# Patient Record
Sex: Female | Born: 1970 | Race: White | Hispanic: No | Marital: Married
Health system: Midwestern US, Community
[De-identification: ages and names within clinical notes are randomized; demographics above are authoritative.]

## PROBLEM LIST (undated history)

## (undated) DIAGNOSIS — G43829 Menstrual migraine, not intractable, without status migrainosus: Secondary | ICD-10-CM

## (undated) DIAGNOSIS — R928 Other abnormal and inconclusive findings on diagnostic imaging of breast: Secondary | ICD-10-CM

## (undated) DIAGNOSIS — R922 Inconclusive mammogram: Secondary | ICD-10-CM

## (undated) DIAGNOSIS — E039 Hypothyroidism, unspecified: Secondary | ICD-10-CM

## (undated) HISTORY — PX: TONSILLECTOMY: SUR1361

---

## 2008-12-04 MED ORDER — DEXAMETHASONE 0.5 MG TAB
0.5 mg | ORAL_TABLET | ORAL | Status: DC
Start: 2008-12-04 — End: 2009-02-03

## 2008-12-04 MED ORDER — LEVOTHYROXINE 50 MCG TAB
50 mcg | ORAL_TABLET | Freq: Every day | ORAL | Status: DC
Start: 2008-12-04 — End: 2009-02-03

## 2008-12-04 NOTE — Patient Instructions (Signed)
1) Take Dexamethasone 0.5 mg 2 tabs at 11pm the night before you come for lab draw between 7:30-9am the next morning.  Come to the lab tomorrow at 8:15am.    2) Take thyroid supplement on an empty stomach (either first thing in the morning or at bedtime), 4 hours apart from any vitamin, iron, or calcium supplement.  If you miss a dose, you can take 2 tablets the next day to catch up.    3) Call [facility] at [phone] 1 week before next appointment to schedule lab draw 2-3 days before next appointment.

## 2008-12-04 NOTE — Progress Notes (Addendum)
Chief Complaint   Patient presents with   ??? Thyroid Problem       History of Present Illness: Cheryl Carter is a 38 y.o. female referred by Dr. Clair Gulling for evaluation of her thyroid.  Went to see Dr. Debara Pickett in February for evaluation of a few symptoms: 10 lb wt gain over 3 months, cold intolerance, fatigue.  Skin is always dry in the winter.  Is currently living in a house with well water so has noticed some change in her hair and nails and doesn't know if these are related to the water.  No neck pain or swelling.  Bowels tend to stay loose.  Paternal grandmother had thyroid disease and RA but no other family members with thyroid disease.  As part of her evaluation had a TSH level drawn that was mildly elevated at 5.6 and a morning cortisol that was elevated at 23.1.  No striae or easy bruising.  No proximal muscles weakness.  No history of high blood pressure or diabetes.  She states she is currently under a lot of stress at home.    Past Medical History   Diagnosis Date   ??? Migraines    ??? GI Symptoms      sees Dr. Sandie Ano       Past Surgical History   Procedure Date   ??? Hx tonsillectomy        Current outpatient prescriptions   Medication Sig   ??? BIFIDOBACTERIUM INFANTIS (ALIGN PO) Take  by mouth daily.       Allergies   Allergen Reactions   ??? Floxin (Ofloxacin) Hives   ??? Macrobid (Nitrofurantoin (Macrocryst25%)) Hives   ??? Sulfa (Sulfonamide Antibiotics) Hives       Family History   Problem Relation   ??? Thyroid Disease Paternal Grandmother       History   Social History   ??? Marital Status: Married     Spouse Name: N/A     Number of Children: N/A   ??? Years of Education: N/A   Occupational History   ??? Not on file.   Social History Main Topics   ??? Tobacco Use: Never   ??? Alcohol Use: No   ??? Drug Use: No   ??? Sexually Active:    Other Topics Concern   ??? Not on file   Social History Narrative    Lives in [place] with husband of 7 years and 45 yo son and 69 yo daughter.   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  Likes hiking and camping and crafts.       Review of Systems:  - Constitutional Symptoms: no fevers, chills, (+) weight gain as above  - Eyes: (+) blurry vision and double vision only with migraines  - Cardiovascular: no chest pain, occ palpitations  - Respiratory: no cough, occ shortness of breath  - Gastrointestinal: no dysphagia or abdominal pain  - Musculoskeletal: no joint pains or weakness  - Integumentary: no rashes  - Neurological: occ numbness, tingling in legs when driving, (+) headaches  - Psychiatric: has felt depressed over the past 3 months with her health  - Endocrine: mild cold intolerance, no polyuria or polydipsia    Physical Examination:  Blood pressure 126/80, pulse 76, height 5\' 7"  (1.702 m), weight 140 lb 4.8 oz (63.64 kg).  - General: pleasant, no distress, good eye contact  - HEENT: no exopthalmos, no periorbital edema, no scleral/conjunctival injection, EOMI, no lid lag  or stare, no facial puffiness  - Neck: supple, small goiter without nodules, no masses, lymph nodes, or carotid bruits, no supraclavicular or dorsocervical fat pads  - Cardiovascular: regular, normal rate, normal S1 and S2, no murmurs/rubs/gallops   - Respiratory: clear to auscultation bilaterally  - Gastrointestinal: soft, nontender, nondistended, no masses, no hepatosplenomegaly  - Musculoskeletal: no proximal muscle weakness in upper or lower extremities  - Integumentary: no acanthosis nigricans, no abdominal striae, no rashes, no edema  - Neurological: reflexes 2+ at biceps, no tremor  - Psychiatric: normal mood and affect    Data Reviewed:   - labs as listed above  - office note from Dr. Debara Pickett    Assessment/Plan:    1. Unspecified Hypothyroidism (244.9) She is clinically mildly hypothyroid and her last TSH level was mildly elevated at 5.6.  We discussed starting a low dose of thyroid hormone to see if she feels better. Goal TSH is 0.5-2.0.  At this time she does not plan to have further children and her husband had a vasectomy.  I told her if she ever does desire pregnancy to make sure she has her TSH checked prior to trying to conceive.  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 It is more likely that her elevated cortisol level is due to pain or stress but to make sure will perform a dexamethasone suppression test.?? If this test is normal, she will not need further evaluation of her cortisol levels.  - take 1 mg of dexamethasone tonight at 11pm and check morning cortisol tomorrow morning at 8:15 am         Patient Instructions   1) Take Dexamethasone 0.5 mg 2 tabs at 11pm the night before you come for lab draw between 7:30-9am the next morning.  Come to the lab tomorrow at 8:15am.    2) Take thyroid supplement on an empty stomach (either first thing in the morning or at bedtime), 4 hours apart from any vitamin, iron, or calcium supplement.  If you miss a dose, you can take 2 tablets the next day to catch up.    3) Call [facility] at [phone] 1 week before next appointment to schedule lab draw 2-3 days before next appointment.          Follow-up Disposition:  Return in about 2 months (around 02/03/2009).    Copy sent to:  1) Dr. Clair Gulling [phone]  2) Dr. Mort Sawyers [phone]    Lab follow up:  Component Latest Ref Rng 12/05/2008   CORTISOL, SERUM AM CORTA 4.3 - 22.4 ug/dL 1.1 (L)      Her overnight dex suppression test came back normal (<1.8) so she does not have Cushings and does not need further evaluation of her cortisol.  Sent her a letter to this effect.

## 2008-12-06 LAB — CORTISOL, AM: Cortisol, a.m.: 1.1 ug/dL — ABNORMAL LOW (ref 4.3–22.4)

## 2009-01-31 ENCOUNTER — Ambulatory Visit

## 2009-01-31 LAB — TSH 3RD GENERATION: TSH: 1.61 u[IU]/mL (ref 0.36–3.74)

## 2009-01-31 LAB — T4, FREE: T4, Free: 1.1 NG/DL (ref 0.8–1.5)

## 2009-02-03 MED ORDER — LEVOTHYROXINE 75 MCG TAB
75 mcg | ORAL_TABLET | Freq: Every day | ORAL | Status: DC
Start: 2009-02-03 — End: 2009-04-09

## 2009-02-03 NOTE — Patient Instructions (Signed)
1) We will try an increased dose of levothyroxine 75 mcg daily for the next 2 months.  If you are still not having any improvement in your fatigue, we will have to work with Dr. Debara Pickett to make sure there aren't other non-endocrine causes for your fatigue.    2) Call [facility] at [phone] 1 week before next appointment to schedule lab draw 2-3 days before next appointment.

## 2009-02-03 NOTE — Progress Notes (Signed)
Chief Complaint   Patient presents with   ??? Thyroid Problem       History of Present Illness: Cheryl Carter is a 38 y.o. female here for follow up of thyroid.  Since last visit she has not noticed any difference in how she feels since starting the levothyroxine 50 mcg daily.  She has continued to gain a small amount of weight, 3 lbs since last visit.  Her overall energy is still very poor and she feels by Thursday each week she needs a nap in the afternoon.  She continues to have poor sleep and has been under a lot of stress with her family potentially moving to [place] but now they are not going to move and they need to find a house, though she states she has been dealing with the stress well and doesn't think this is playing too much of a role.  She has had some irregular periods over the past few months but previously was very regular.  She had no trouble conceiving her 2 children.  She has not had any abnormal hair growth on her face, chest or abdomen.    Current outpatient prescriptions   Medication Sig   ??? loratadine (CLARITIN) 10 mg tablet Take 10 mg by mouth daily.   ??? levothyroxine (SYNTHROID) 50 mcg tablet Take 1 Tab by mouth daily (before breakfast).   ??? BIFIDOBACTERIUM INFANTIS (ALIGN PO) Take  by mouth daily.       Allergies   Allergen Reactions   ??? Floxin (Ofloxacin) Hives   ??? Macrobid (Nitrofurantoin (Macrocryst25%)) Hives   ??? Sulfa (Sulfonamide Antibiotics) Hives       Review of Systems: Not asked today    Physical Examination:  - Blood pressure 119/81, pulse 76, height 5\' 7"  (1.702 m), weight 143 lb 9.6 oz (65.137 kg).  - General: pleasant, no distress, good eye contact   Full exam not performed    Data Reviewed:   Component Latest Ref Rng 01/30/2009   TSH TSH 0.36 - 3.74 UIU/ML 1.61   T4 FREE FT4 0.8 - 1.5 NG/DL 1.1     Assessment/Plan:    1. Unspecified hypothyroidism (244.9) She still has some symptoms of hypothyroidism such as fatigue and weight gain though her TSH is now in the normal range.  We spent 15 minutes together and > 50% of the time was spent counseling her on the fact that her symptoms may not be related to her thyroid and there may be another cause that is not endocrine related.  We decided that we will try to increase her dose of levothyroxine to see if we can lower her TSH to closer to 0.5 to see if this makes any difference in how she feels.  However, if she still doesn't notice any difference, then she will have to work with Dr. Debara Pickett to determine any other causes of her fatigue.  - increase levothyroxine to 75 mcg daily  - check TSH and free T4 prior to next visit     2. 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  - check total testosterone prior to next visit         Patient Instructions   1) We will try an increased dose of levothyroxine 75 mcg daily for the next 2 months.  If you  are still not having any improvement in your fatigue, we will have to work with Dr. Debara Pickett to make sure there aren't other non-endocrine causes for your fatigue.    2) Call [facility] at [phone] 1 week before next appointment to schedule lab draw 2-3 days before next appointment.      Follow-up Disposition:  Return in about 2 months (around 04/05/2009).    Copy sent to:  1) Dr. Clair Gulling [phone]  2) Dr. Mort Sawyers [phone]

## 2009-04-04 ENCOUNTER — Ambulatory Visit

## 2009-04-05 LAB — TSH 3RD GENERATION: TSH: 0.31 u[IU]/mL — ABNORMAL LOW (ref 0.36–3.74)

## 2009-04-05 LAB — T4, FREE: T4, Free: 1.2 NG/DL (ref 0.8–1.5)

## 2009-04-08 LAB — TESTOSTERONE, TOTAL, FEMALE/CHILD: Testosterone: 24 ng/dL (ref 11–56)

## 2009-04-09 MED ORDER — LEVOTHYROXINE 75 MCG TAB
75 mcg | ORAL_TABLET | Freq: Every day | ORAL | Status: AC
Start: 2009-04-09 — End: 2010-04-09

## 2009-04-09 NOTE — Progress Notes (Signed)
Chief Complaint   Patient presents with   ??? Thyroid Problem       History of Present Illness: Cheryl Carter is a 38 y.o. female here for follow up of thyroid.  Has felt a lot better since being on the higher dose of levothyroxine 75 mcg daily.  No longer feels like she needs to take a nap.  Has lost 3 lbs since last visit.  Still under stress as her family is about to close on a house in [place] in the next week.  No palpitations, tremors, or diarrhea.    Current outpatient prescriptions   Medication Sig   ??? loratadine (CLARITIN) 10 mg tablet Take 10 mg by mouth daily.   ??? levothyroxine (SYNTHROID) 75 mcg tablet Take 1 Tab by mouth daily (before breakfast).   ??? BIFIDOBACTERIUM INFANTIS (ALIGN PO) Take  by mouth daily.       Allergies   Allergen Reactions   ??? Floxin (Ofloxacin) Hives   ??? Macrobid (Nitrofurantoin (Macrocryst25%)) Hives   ??? Sulfa (Sulfonamide Antibiotics) Hives       Review of Systems:  - Cardiovascular: no chest pain  - Neurological: no tremors  - Integumentary: skin is normal    Physical Examination:  - Blood pressure 130/82, pulse 85, height 5\' 7"  (1.702 m), weight 140 lb 8 oz (63.73 kg).  - General: pleasant, no distress, good eye contact   - Cardiovascular: regular, normal rate, nl s1 and s2, no m/r/g   - Integumentary: skin is normal  - Neurological: reflexes 2+ at biceps, no tremors  - Psychiatric: normal mood and affect    Data Reviewed:   Component Latest Ref Rng 04/04/2009   TSH TSH 0.36 - 3.74 UIU/ML 0.31 (L)   T4 FREE FT4 0.8 - 1.5 NG/DL 1.2   TESTOSTERONE. 11 - 56 ng/dL 24       Assessment/Plan:     1. Unspecified hypothyroidism (244.9) She is clinically euthyroid at this time though her TSH is just slightly below the lower limit of normal.  I feel it is safe to continue her on her current dose of levothyroxine for now but we will make sure her TSH does not drop below 0.25 or we may need to cut back on her dose.    - cont levothyroxine 75 mcg daily   - check TSH and free T4 prior to next visit     2. 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          Patient Instructions   1) Go to [facility] 3-4 days before your next appointment to have your labs drawn.      Follow-up Disposition:  Return in about 6 months (around 10/10/2009).    Copy sent to:  1) Dr. Clair Gulling [phone]  2) Dr. Mort Sawyers [phone]

## 2009-04-09 NOTE — Patient Instructions (Signed)
1) Go to [facility] 3-4 days before your next appointment to have your labs drawn.

## 2018-03-29 ENCOUNTER — Encounter

## 2018-04-13 ENCOUNTER — Ambulatory Visit: Attending: Surgery | Primary: Obstetrics & Gynecology

## 2018-04-13 ENCOUNTER — Ambulatory Visit
Admit: 2018-04-13 | Discharge: 2018-04-13 | Payer: PRIVATE HEALTH INSURANCE | Attending: Surgery | Primary: Obstetrics & Gynecology

## 2018-04-13 ENCOUNTER — Telehealth

## 2018-04-13 DIAGNOSIS — R928 Other abnormal and inconclusive findings on diagnostic imaging of breast: Secondary | ICD-10-CM

## 2018-04-13 NOTE — Telephone Encounter (Signed)
stereotactic biopsy  Scheduled for next week

## 2018-04-13 NOTE — Progress Notes (Signed)
Type of Film: [x] CD [] FILMS  Type of Test: [] MRI [] MAMMO  From: VPFQ  Given to: placed in shredder after upload to PACS

## 2018-04-13 NOTE — Progress Notes (Signed)
HISTORY OF PRESENT ILLNESS  Cheryl Carter is a 47 y.o. female.  HPI  NEW patient consult referred by  Dr. Rosaura Carpenteruckey-Larus  for abnormal RIGHT breast mammogram. Wants to talk about biopsy and if she really needs it.  Has felt a RIGHT breast lump the last couple of years which is a known cyst.  She has not required a biopsy before. Denies skin changes and nipple retraction/discharge.     OB History   Gravida Para Term Preterm AB Living   2 0 0 0 0 0   SAB TAB Ectopic Molar Multiple Live Births   0 0 0 0 0 2   Obstetric Comments   Menarche [age], LMP 04/04/18, # of children 2, age of 1st delivery [age], Hysterectomy/oophorectomy no/no, Breast bx no, history of breast feeding yes, BCP yes, Hormone therapy no     Family History:    Denies FH of breast or ovarian cancer.       RIGHT dx Mammogram/us, 03/21/18, [facility], BIRADS 4  RIGHT 11:00 posterior 7cfn 1.4 cm architectural distortion  No ultrasound correlated.  Pt has several cysts including a 4.3 cm cyst    Review of Systems   HENT: Positive for sore throat.    Gastrointestinal: Positive for diarrhea.   Neurological: Positive for headaches.   All other systems reviewed and are negative.      Physical Exam   Constitutional: She is oriented to person, place, and time. She appears well-developed and well-nourished.   Cardiovascular: Normal rate, regular rhythm and normal heart sounds.   Pulmonary/Chest: Effort normal and breath sounds normal. Right breast exhibits mass (3 cm soft mobile known cyst LOQ). Right breast exhibits no inverted nipple, no nipple discharge, no skin change and no tenderness. Left breast exhibits no inverted nipple, no mass, no nipple discharge, no skin change and no tenderness. Breasts are symmetrical.       Lymphadenopathy:     She has no axillary adenopathy.   Neurological: She is alert and oriented to person, place, and time.   Skin: Skin is warm and dry.     Past Medical History:   Diagnosis Date   ??? GI symptoms     sees Dr. Sandie Anouckworth    ??? Migraines      Past Surgical History:   Procedure Laterality Date   ??? HX TONSILLECTOMY        OB History     Gravida   2    Para        Term        Preterm        AB        Living           SAB        TAB        Ectopic        Molar        Multiple        Live Births   2          Obstetric Comments   Menarche [age], LMP 04/04/18, # of children 2, age of 1st delivery 89, Hysterectomy/oophorectomy no/no, Breast bx no, history of breast feeding yes, BCP yes, Hormone therapy no           Family History   Problem Relation Age of Onset   ??? Thyroid Disease Paternal Grandmother      Social History     Tobacco Use   ??? Smoking status: Never Smoker  Substance Use Topics   ??? Alcohol use: No      Prior to Admission medications    Medication Sig Start Date End Date Taking? Authorizing Provider   ZOLMitriptan (ZOMIG) 5 mg tablet TAKE 1 TABLET BY MOUTH ONCE DAILY AS NEEDED AT ONSET OF MIGRAINE 02/20/18  Yes Provider, Historical   multivitamin (ONE A DAY) tablet Take 1 Tab by mouth daily.   Yes Provider, Historical   loratadine (CLARITIN) 10 mg tablet Take 10 mg by mouth daily.    Provider, Historical   BIFIDOBACTERIUM INFANTIS (ALIGN PO) Take  by mouth daily. 12/04/08   Provider, Historical      Allergies   Allergen Reactions   ??? Floxin [Ofloxacin] Hives   ??? Macrobid [Nitrofurantoin Monohyd/M-Cryst] Hives   ??? Sulfa (Sulfonamide Antibiotics) Hives     ASSESSMENT and PLAN    ICD-10-CM ICD-9-CM    1. Abnormal mammogram R92.8 793.80      We reviewed her mammograms together.    She has a new area of architectural distortion that is not seen on ultrasound.  Explained that the odds of this being cancer is about 20%.  Stereotactic biopsy is indicated.   Pt agrees,  Procedure scheduled for next week.    She is moving to [place] in 3 weeks

## 2018-04-13 NOTE — Progress Notes (Signed)
Type of Film: [x]  CD []  FILMS  Type of Test: []  MRI []  MAMMO  From: VPFQ  Given to: placed in shredder after upload to PACS

## 2018-04-13 NOTE — Progress Notes (Addendum)
Progress Notes by Johnell ComingsStephens, Nabeeha Badertscher, MD at 04/13/18 0920                Author: Johnell ComingsStephens, Jaleeah Slight, MD  Service: --  Author Type: Physician       Filed: 04/13/18 1027  Encounter Date: 04/13/2018  Status: Signed          Editor: Johnell ComingsStephens, Moss Berry, MD (Physician)               HISTORY OF PRESENT ILLNESS   Cheryl Carter is a 47 y.o.  female.   HPI  NEW patient consult referred by  Dr. Rosaura Carpenteruckey-Larus  for abnormal RIGHT breast mammogram. Wants to talk about biopsy  and if she really needs it.  Has felt a RIGHT breast lump the last couple of years which is a known cyst.  She has not required a biopsy before. Denies skin changes and nipple retraction/discharge.         OB History            Gravida  Para  Term  Preterm  AB  Living            2  0  0  0  0  0            SAB  TAB  Ectopic  Molar  Multiple  Live Births            0  0  0  0  0  2       Obstetric Comments       Menarche [age], LMP 04/04/18, # of children 2, age of 1st delivery [age], Hysterectomy/oophorectomy no/no, Breast bx no, history of breast feeding yes, BCP  yes, Hormone therapy no        Family History:      Denies FH of breast or ovarian cancer.          RIGHT dx Mammogram/us, 03/21/18, [facility], BIRADS 4   RIGHT 11:00 posterior 7cfn 1.4 cm architectural distortion   No ultrasound correlated.  Pt has several cysts including a 4.3 cm cyst      Review of Systems    HENT: Positive for sore throat.     Gastrointestinal: Positive for diarrhea.    Neurological: Positive for headaches.    All other systems reviewed and are negative.         Physical Exam    Constitutional: She is oriented to person, place, and time. She appears well-developed and well-nourished.    Cardiovascular: Normal rate, regular rhythm and normal heart sounds.    Pulmonary/Chest: Effort normal and breath sounds normal. Right breast exhibits mass  (3 cm soft mobile known cyst LOQ). Right breast exhibits no inverted nipple, no nipple discharge, no skin change and no tenderness.  Left breast exhibits no inverted nipple, no mass, no nipple discharge,  no skin change and no tenderness. Breasts are symmetrical.         Lymphadenopathy:     She has no axillary adenopathy.   Neurological: She is alert and oriented to person, place, and time.    Skin: Skin is warm and dry.         Past Medical History:        Diagnosis  Date         ?  GI symptoms            sees Dr. Sandie Anouckworth         ?  Migraines  Past Surgical History:         Procedure  Laterality  Date          ?  HX TONSILLECTOMY               OB History                Gravida      2                Para                       Term                       Preterm                       AB                       Living                                    SAB                       TAB                       Ectopic                       Molar                       Multiple                       Live Births      2                            Obstetric Comments      Menarche 75, LMP 04/04/18, # of children 2, age of 1st delivery 43, Hysterectomy/oophorectomy no/no, Breast bx no, history of breast  feeding yes, BCP yes, Hormone therapy no                          Family History         Problem  Relation  Age of Onset          ?  Thyroid Disease  Paternal Grandmother            Social History          Tobacco Use         ?  Smoking status:  Never Smoker       Substance Use Topics         ?  Alcohol use:  No           Prior to Admission medications             Medication  Sig  Start Date  End Date  Taking?  Authorizing Provider            ZOLMitriptan (ZOMIG) 5 mg tablet  TAKE 1 TABLET BY MOUTH ONCE DAILY AS NEEDED AT ONSET OF MIGRAINE  02/20/18    Yes  Provider, Historical  multivitamin (ONE A DAY) tablet  Take 1 Tab by mouth daily.      Yes  Provider, Historical            loratadine (CLARITIN) 10 mg tablet  Take 10 mg by mouth daily.        Provider, Historical            BIFIDOBACTERIUM INFANTIS (ALIGN PO)  Take  by mouth daily.   12/04/08      Provider, Historical           Allergies        Allergen  Reactions         ?  Floxin [Ofloxacin]  Hives     ?  Macrobid [Nitrofurantoin Monohyd/M-Cryst]  Hives         ?  Sulfa (Sulfonamide Antibiotics)  Hives        ASSESSMENT and PLAN             ICD-10-CM  ICD-9-CM             1.  Abnormal mammogram  R92.8  793.80          We reviewed her mammograms together.     She has a new area of architectural distortion that is not seen on ultrasound.   Explained that the odds of this being cancer is about 20%.   Stereotactic biopsy is indicated.    Pt agrees,   Procedure scheduled for next week.      She is moving to [place] in 3 weeks

## 2018-04-20 ENCOUNTER — Ambulatory Visit: Attending: Surgery | Primary: Obstetrics & Gynecology

## 2018-04-20 ENCOUNTER — Inpatient Hospital Stay: Payer: BLUE CROSS/BLUE SHIELD | Attending: Body Imaging | Primary: Obstetrics & Gynecology

## 2018-04-20 ENCOUNTER — Inpatient Hospital Stay: Payer: BLUE CROSS/BLUE SHIELD | Attending: Surgery | Primary: Obstetrics & Gynecology

## 2018-04-20 ENCOUNTER — Encounter

## 2018-04-20 ENCOUNTER — Ambulatory Visit
Admit: 2018-04-20 | Discharge: 2018-04-20 | Payer: PRIVATE HEALTH INSURANCE | Attending: Surgery | Primary: Obstetrics & Gynecology

## 2018-04-20 DIAGNOSIS — R928 Other abnormal and inconclusive findings on diagnostic imaging of breast: Secondary | ICD-10-CM

## 2018-04-20 NOTE — Patient Instructions (Addendum)
Stereotactic Breast Biopsy: About This Test  What is it?  Stereotactic breast biopsy is a test that uses imaging, such as X-ray, to find an area of your breast where a tissue sample will be taken. The sample is looked at under a microscope to check for signs of breast cancer.  Why is this test done?  This type of breast biopsy is usually done to check for cancer in a lump found during a mammogram.  How can you prepare for the test?  Talk to your doctor about all your health conditions before the test. For example, tell your doctor if you:  ?? Are taking any medicines.  ?? Are allergic to any medicines.  ?? Are allergic to latex.  ?? Have had bleeding problems, or if you take aspirin or some other blood thinner.  ?? Are or might be pregnant.  What happens before the test?  ?? You will take off your clothing above the waist. A paper or cloth gown will cover your shoulders.  ?? Your skin is washed with a special soap.  ?? You will be given a shot of medicine to numb the biopsy area on your breast.  ?? Images are taken to find the exact site for the biopsy.  What happens during the test?  ?? You may sit in a chair, or you may lie on your stomach on a table that has a hole for your breast to hang through.  ?? Once the area in your breast is numb, a small cut (incision) is made in the skin.  ?? Using the imaging, the doctor will guide the needle into the biopsy area.  ?? A sample of breast tissue is taken through the needle.  ?? A small clip is usually inserted into your breast to mark the biopsy site.  ?? The needle is removed and pressure put on the needle site to stop any bleeding.  ?? A bandage is put on the needle site.  What else should you know about the test?  ?? The small cut for the needle does not usually need stitches.  How long does the test take?  ?? The test will take about 60 minutes. Most of the time is spent preparing for the images and finding the area for the biopsy.  What happens after the test?   ?? You'll be told how long it may take to get your results back.  ?? You will probably be able to go home right away.  ?? You can go back to your usual activities right away, but avoid heavy lifting for 24 hours.  ?? The site may be tender for 2 or 3 days. You may also have some bruising, swelling, or slight bleeding.  ? You can use an ice pack. Put ice or a cold pack on the area for 10 to 20 minutes at a time. Put a thin cloth between the ice and your skin.  ? Ask your doctor if you can take an over-the-counter pain medicine, such as acetaminophen (Tylenol), ibuprofen (Advil, Motrin), or naproxen (Aleve). Be safe with medicines. Read and follow all instructions on the label.  ?? After a specialist looks at the biopsy sample for signs of cancer, your doctor's office will let you know the results.  ?? If the test results are not clear, you may have another biopsy or test.  Follow-up care is a key part of your treatment and safety. Be sure to make and go to all appointments, and call   your doctor if you are having problems. It's also a good idea to keep a list of the medicines you take. Ask your doctor when you can expect to have your test results.  Where can you learn more?  Go to http://www.healthwise.net/GoodHelp[employer]ctions.  Enter Y286 in the search box to learn more about "Stereotactic Breast Biopsy: About This Test."  Current as of: August 31, 2017  Content Version: 12.1  ?? 2006-2019 [employer]. Care instructions adapted under license by [employer] (which disclaims liability or warranty for this information). If you have questions about a medical condition or this instruction, always ask your healthcare professional. [employer]wise, [employer] disclaims any warranty or liability for your use of this information.         Stereotactic Breast Biopsy: About This Test  What is it?  Stereotactic breast biopsy is a test that uses imaging, such as X-ray, to  find an area of your breast where a tissue sample will be taken. The sample is looked at under a microscope to check for signs of breast cancer.  Why is this test done?  This type of breast biopsy is usually done to check for cancer in a lump found during a mammogram.  How can you prepare for the test?  Talk to your doctor about all your health conditions before the test. For example, tell your doctor if you:  ?? Are taking any medicines.  ?? Are allergic to any medicines.  ?? Are allergic to latex.  ?? Have had bleeding problems, or if you take aspirin or some other blood thinner.  ?? Are or might be pregnant.  What happens before the test?  ?? You will take off your clothing above the waist. A paper or cloth gown will cover your shoulders.  ?? Your skin is washed with a special soap.  ?? You will be given a shot of medicine to numb the biopsy area on your breast.  ?? Images are taken to find the exact site for the biopsy.  What happens during the test?  ?? You may sit in a chair, or you may lie on your stomach on a table that has a hole for your breast to hang through.  ?? Once the area in your breast is numb, a small cut (incision) is made in the skin.  ?? Using the imaging, the doctor will guide the needle into the biopsy area.  ?? A sample of breast tissue is taken through the needle.  ?? A small clip is usually inserted into your breast to mark the biopsy site.  ?? The needle is removed and pressure put on the needle site to stop any bleeding.  ?? A bandage is put on the needle site.  What else should you know about the test?  ?? The small cut for the needle does not usually need stitches.  How long does the test take?  ?? The test will take about 60 minutes. Most of the time is spent preparing for the images and finding the area for the biopsy.  What happens after the test?  ?? You'll be told how long it may take to get your results back.  ?? You will probably be able to go home right away.   ?? You can go back to your usual activities right away, but avoid heavy lifting for 24 hours.  ?? The site may be tender for 2 or 3 days. You may also have some bruising, swelling, or slight bleeding.  ?   You can use an ice pack. Put ice or a cold pack on the area for 10 to 20 minutes at a time. Put a thin cloth between the ice and your skin.  ? Ask your doctor if you can take an over-the-counter pain medicine, such as acetaminophen (Tylenol), ibuprofen (Advil, Motrin), or naproxen (Aleve). Be safe with medicines. Read and follow all instructions on the label.  ?? After a specialist looks at the biopsy sample for signs of cancer, your doctor's office will let you know the results.  ?? If the test results are not clear, you may have another biopsy or test.  Follow-up care is a key part of your treatment and safety. Be sure to make and go to all appointments, and call your doctor if you are having problems. It's also a good idea to keep a list of the medicines you take. Ask your doctor when you can expect to have your test results.  Where can you learn more?  Go to http://www.healthwise.net/GoodHelpConnections.  Enter Y286 in the search box to learn more about "Stereotactic Breast Biopsy: About This Test."  Current as of: August 31, 2017  Content Version: 12.1  ?? 2006-2019 [employer]. Care instructions adapted under license by [employer] (which disclaims liability or warranty for this information). If you have questions about a medical condition or this instruction, always ask your healthcare professional. [employer], [employer] disclaims any warranty or liability for your use of this information.

## 2018-04-20 NOTE — Progress Notes (Signed)
HISTORY OF PRESENT ILLNESS  Cheryl Carter is a 46 y.o. female.  HPI ESTABLISHED patient here for RIGHT breast stereotactic biopsy for architectural distortion seen on 3d screening mammogram.  She was seen in the office last week  RIGHT STBX was scheduled for today.    RIGHT dx Mammogram/us, 03/21/18, [facility], BIRADS 4  RIGHT 11:00 posterior 7cfn 1.4 cm architectural distortion  No ultrasound correlated.  Pt has several cysts including a 4.3 cm cyst    ROS    Physical Exam  Stereotactic Biopsy  PROCEDURE : LORAD STEREOTACTIC VACUUM ASSISTED BIOPSY AND RELATED DIGITAL MAMMOGRAPHY OF THE, Right BREAST.   INDICATION : ARCHITECTURAL DISTORTION   CONSENT : Following a detailed description of the LORAD stereotactic core biopsy procedure, its risks, benefits and possible alternatives (open biopsy), the patient signed the informed consent. The risks and benefits of the procedure have been explained to the patient by the stereotactic technologist and Dr. Smriti Barkow.   IMAGING : Prebiopsy digital stereotactic imaging of the breast was obtained.  We were unable to identify a discrete area of architectural distortion.      ASSESSMENT and PLAN      ICD-10-CM ICD-9-CM    1. Abnormal mammogram R92.8 793.80      1. Unable to do a Stereotactic biopsy as we couldn't find a discrete mass on the stereotactic machine.  Pt has dense breast tissue.   Lesion was identified on 3d mammogram. Our machine is 2d.  2.discussed options with [name].  She is moving out of [place] in 2 weeks.  She can have a follow up mammogram in 6 months or she could find a breast surgeon or radiologist with a 3d stereotactic machine and try again to do a stereotactic biopsy.     She has her films and reports.

## 2018-04-20 NOTE — Progress Notes (Signed)
HISTORY OF PRESENT ILLNESS  Cheryl SanesKathleen A Carter is a 47 y.o. female.  HPI ESTABLISHED patient here for RIGHT breast stereotactic biopsy for architectural distortion seen on 3d screening mammogram.  She was seen in the office last week  RIGHT STBX was scheduled for today.    RIGHT dx Mammogram/us, 03/21/18, [facility], BIRADS 4  RIGHT 11:00 posterior 7cfn 1.4 cm architectural distortion  No ultrasound correlated.  Pt has several cysts including a 4.3 cm cyst    ROS    Physical Exam  Stereotactic Biopsy  PROCEDURE : LORAD STEREOTACTIC VACUUM ASSISTED BIOPSY AND RELATED DIGITAL MAMMOGRAPHY OF THE, Right BREAST.   INDICATION : ARCHITECTURAL DISTORTION   CONSENT : Following a detailed description of the LORAD stereotactic core biopsy procedure, its risks, benefits and possible alternatives (open biopsy), the patient signed the informed consent. The risks and benefits of the procedure have been explained to the patient by the stereotactic technologist and Dr. Zonia Carter.   IMAGING : Prebiopsy digital stereotactic imaging of the breast was obtained.  We were unable to identify a discrete area of architectural distortion.      ASSESSMENT and PLAN      ICD-10-CM ICD-9-CM    1. Abnormal mammogram R92.8 793.80      1. Unable to do a Stereotactic biopsy as we couldn't find a discrete mass on the stereotactic machine.  Pt has dense breast tissue.   Lesion was identified on 3d mammogram. Our machine is 2d.  2.discussed options with Cheryl Carter.  She is moving out of state in 2 weeks.  She can have a follow up mammogram in 6 months or she could find a breast surgeon or radiologist with a 3d stereotactic machine and try again to do a stereotactic biopsy.     She has her films and reports.

## 2018-10-19 DIAGNOSIS — N6311 Unspecified lump in the right breast, upper outer quadrant: Secondary | ICD-10-CM | POA: Diagnosis not present

## 2018-10-19 DIAGNOSIS — Z6821 Body mass index (BMI) 21.0-21.9, adult: Secondary | ICD-10-CM | POA: Diagnosis not present

## 2018-10-19 DIAGNOSIS — N6315 Unspecified lump in the right breast, overlapping quadrants: Secondary | ICD-10-CM | POA: Diagnosis not present

## 2018-10-19 DIAGNOSIS — N6001 Solitary cyst of right breast: Secondary | ICD-10-CM | POA: Diagnosis not present

## 2018-10-19 DIAGNOSIS — Z882 Allergy status to sulfonamides status: Secondary | ICD-10-CM | POA: Diagnosis not present

## 2018-10-19 DIAGNOSIS — R928 Other abnormal and inconclusive findings on diagnostic imaging of breast: Secondary | ICD-10-CM | POA: Diagnosis not present

## 2018-11-08 ENCOUNTER — Other Ambulatory Visit: Payer: Self-pay

## 2018-11-08 ENCOUNTER — Other Ambulatory Visit: Payer: Self-pay | Admitting: Obstetrics & Gynecology

## 2018-11-08 ENCOUNTER — Other Ambulatory Visit: Payer: Self-pay | Admitting: Student

## 2018-11-08 ENCOUNTER — Other Ambulatory Visit: Payer: Self-pay | Admitting: Surgical Oncology

## 2018-11-08 DIAGNOSIS — N6489 Other specified disorders of breast: Secondary | ICD-10-CM

## 2018-11-14 ENCOUNTER — Other Ambulatory Visit: Payer: Self-pay | Admitting: Obstetrics & Gynecology

## 2018-11-14 DIAGNOSIS — N6489 Other specified disorders of breast: Secondary | ICD-10-CM

## 2018-11-21 ENCOUNTER — Other Ambulatory Visit: Payer: Self-pay | Admitting: Obstetrics & Gynecology

## 2018-11-21 ENCOUNTER — Ambulatory Visit
Admission: RE | Admit: 2018-11-21 | Discharge: 2018-11-21 | Disposition: A | Discharge status: Home / Self Care | Payer: BLUE CROSS/BLUE SHIELD | Source: Ambulatory Visit | Attending: Obstetrics & Gynecology | Admitting: Obstetrics & Gynecology

## 2018-11-21 DIAGNOSIS — N6489 Other specified disorders of breast: Secondary | ICD-10-CM

## 2018-11-21 DIAGNOSIS — N6011 Diffuse cystic mastopathy of right breast: Secondary | ICD-10-CM | POA: Diagnosis not present

## 2018-11-21 DIAGNOSIS — R928 Other abnormal and inconclusive findings on diagnostic imaging of breast: Secondary | ICD-10-CM | POA: Diagnosis not present

## 2018-11-21 HISTORY — PX: BREAST BIOPSY: SHX20

## 2019-03-06 DIAGNOSIS — N912 Amenorrhea, unspecified: Secondary | ICD-10-CM | POA: Diagnosis not present

## 2019-03-06 DIAGNOSIS — E063 Autoimmune thyroiditis: Secondary | ICD-10-CM | POA: Diagnosis not present

## 2019-03-06 DIAGNOSIS — J302 Other seasonal allergic rhinitis: Secondary | ICD-10-CM | POA: Diagnosis not present

## 2019-03-06 DIAGNOSIS — G43009 Migraine without aura, not intractable, without status migrainosus: Secondary | ICD-10-CM | POA: Diagnosis not present

## 2019-06-07 DIAGNOSIS — E063 Autoimmune thyroiditis: Secondary | ICD-10-CM | POA: Diagnosis not present

## 2019-06-07 DIAGNOSIS — Z13220 Encounter for screening for lipoid disorders: Secondary | ICD-10-CM | POA: Diagnosis not present

## 2019-06-07 DIAGNOSIS — Z8249 Family history of ischemic heart disease and other diseases of the circulatory system: Secondary | ICD-10-CM | POA: Diagnosis not present

## 2019-06-07 DIAGNOSIS — Z23 Encounter for immunization: Secondary | ICD-10-CM | POA: Diagnosis not present

## 2019-06-07 DIAGNOSIS — Z0000 Encounter for general adult medical examination without abnormal findings: Secondary | ICD-10-CM | POA: Diagnosis not present

## 2019-07-13 ENCOUNTER — Other Ambulatory Visit: Payer: Self-pay | Admitting: Obstetrics & Gynecology

## 2019-07-13 DIAGNOSIS — Z01419 Encounter for gynecological examination (general) (routine) without abnormal findings: Secondary | ICD-10-CM | POA: Diagnosis not present

## 2019-07-13 DIAGNOSIS — Z1151 Encounter for screening for human papillomavirus (HPV): Secondary | ICD-10-CM | POA: Diagnosis not present

## 2019-07-13 DIAGNOSIS — Z6823 Body mass index (BMI) 23.0-23.9, adult: Secondary | ICD-10-CM | POA: Diagnosis not present

## 2019-07-13 DIAGNOSIS — N6310 Unspecified lump in the right breast, unspecified quadrant: Secondary | ICD-10-CM

## 2019-07-25 ENCOUNTER — Ambulatory Visit
Admission: RE | Admit: 2019-07-25 | Discharge: 2019-07-25 | Disposition: A | Discharge status: Home / Self Care | Payer: BC Managed Care – PPO | Source: Ambulatory Visit | Attending: Obstetrics & Gynecology | Admitting: Obstetrics & Gynecology

## 2019-07-25 ENCOUNTER — Other Ambulatory Visit: Payer: Self-pay | Admitting: Obstetrics & Gynecology

## 2019-07-25 ENCOUNTER — Other Ambulatory Visit: Payer: Self-pay

## 2019-07-25 ENCOUNTER — Ambulatory Visit
Admission: RE | Admit: 2019-07-25 | Discharge: 2019-07-25 | Disposition: A | Discharge status: Home / Self Care | Payer: BLUE CROSS/BLUE SHIELD | Source: Ambulatory Visit | Attending: Obstetrics & Gynecology | Admitting: Obstetrics & Gynecology

## 2019-07-25 DIAGNOSIS — N6310 Unspecified lump in the right breast, unspecified quadrant: Secondary | ICD-10-CM

## 2019-07-25 DIAGNOSIS — N6489 Other specified disorders of breast: Secondary | ICD-10-CM

## 2019-07-25 DIAGNOSIS — N6311 Unspecified lump in the right breast, upper outer quadrant: Secondary | ICD-10-CM | POA: Diagnosis not present

## 2019-07-25 DIAGNOSIS — N6320 Unspecified lump in the left breast, unspecified quadrant: Secondary | ICD-10-CM

## 2019-07-25 DIAGNOSIS — N6002 Solitary cyst of left breast: Secondary | ICD-10-CM | POA: Diagnosis not present

## 2019-07-25 DIAGNOSIS — N6001 Solitary cyst of right breast: Secondary | ICD-10-CM | POA: Diagnosis not present

## 2019-07-25 DIAGNOSIS — R922 Inconclusive mammogram: Secondary | ICD-10-CM | POA: Diagnosis not present

## 2019-08-07 ENCOUNTER — Other Ambulatory Visit: Payer: Self-pay

## 2019-08-07 ENCOUNTER — Ambulatory Visit
Admission: RE | Admit: 2019-08-07 | Discharge: 2019-08-07 | Disposition: A | Discharge status: Home / Self Care | Payer: BC Managed Care – PPO | Source: Ambulatory Visit | Attending: Obstetrics & Gynecology | Admitting: Obstetrics & Gynecology

## 2019-08-07 DIAGNOSIS — N6489 Other specified disorders of breast: Secondary | ICD-10-CM | POA: Diagnosis not present

## 2019-08-07 DIAGNOSIS — N6011 Diffuse cystic mastopathy of right breast: Secondary | ICD-10-CM | POA: Diagnosis not present

## 2019-08-24 ENCOUNTER — Other Ambulatory Visit: Payer: Self-pay | Admitting: Surgery

## 2019-08-24 DIAGNOSIS — N6001 Solitary cyst of right breast: Secondary | ICD-10-CM | POA: Diagnosis not present

## 2019-08-24 DIAGNOSIS — N6021 Fibroadenosis of right breast: Secondary | ICD-10-CM

## 2019-08-29 ENCOUNTER — Other Ambulatory Visit: Payer: Self-pay | Admitting: Surgery

## 2019-08-29 DIAGNOSIS — N6021 Fibroadenosis of right breast: Secondary | ICD-10-CM

## 2019-09-25 ENCOUNTER — Encounter (HOSPITAL_BASED_OUTPATIENT_CLINIC_OR_DEPARTMENT_OTHER): Payer: Self-pay | Admitting: Surgery

## 2019-09-25 ENCOUNTER — Other Ambulatory Visit: Payer: Self-pay

## 2019-09-28 ENCOUNTER — Other Ambulatory Visit (HOSPITAL_COMMUNITY)
Admission: RE | Admit: 2019-09-28 | Discharge: 2019-09-28 | Disposition: A | Discharge status: Home / Self Care | Payer: BC Managed Care – PPO | Source: Ambulatory Visit | Attending: Surgery | Admitting: Surgery

## 2019-09-28 DIAGNOSIS — Z20822 Contact with and (suspected) exposure to covid-19: Secondary | ICD-10-CM | POA: Insufficient documentation

## 2019-09-28 DIAGNOSIS — Z01812 Encounter for preprocedural laboratory examination: Secondary | ICD-10-CM | POA: Diagnosis not present

## 2019-09-28 LAB — SARS CORONAVIRUS 2 (TAT 6-24 HRS): SARS Coronavirus 2: NEGATIVE

## 2019-09-28 MED ORDER — CHLORHEXIDINE GLUCONATE CLOTH 2 % EX PADS: 6.0000 | MEDICATED_PAD | Freq: Once | CUTANEOUS | Status: DC

## 2019-09-28 NOTE — Progress Notes (Signed)

## 2019-10-01 DIAGNOSIS — R928 Other abnormal and inconclusive findings on diagnostic imaging of breast: Secondary | ICD-10-CM | POA: Diagnosis not present

## 2019-10-01 NOTE — H&P (Signed)
Kristin Gay  Location: [facility] Patient #: [id number] DOB: June 26, 1971 Married / Language: English / Race: White Female   History of Present Illness   The patient is a 49 year old female who presents with a complaint of Breast problems. This is a pleasant 49 year old female referred by Dr. Alla Feeling for evaluation of abnormalities right breast. She has had several biopsies of the right breast an area of concern in the upper outer quadrant both in [place] anterior [place]. Most recently, her last biopsy in November showed a complex sclerosing lesion in the area of concern. This is in the upper outer quadrant of the right breast at 10:00. The area is about 1 cm. She also has a palpable 4 cm right breast cyst at the 9 o'clock position. She has no previous history of breast cancer. She denies nipple discharge. There is no family history of breast cancer. She is otherwise healthy and without complaints.   Past Surgical History Breast Biopsy  Right. Tonsillectomy   Diagnostic Studies History  Colonoscopy  >10 years ago Mammogram  within last year Pap Smear  1-5 years ago  Allergies  Sulfa Antibiotics  Allergies Reconciled   Medication History ( ZOLMitriptan (5MG  Tablet, Oral) Active. Medications Reconciled  Social History  Alcohol use  Occasional alcohol use. Caffeine use  Coffee. No drug use  Tobacco use  Never smoker.  Family History  Depression  Sister. Diabetes Mellitus  Mother. Hypertension  Brother, Father, Mother, Sister. Migraine Headache  Mother.  Pregnancy / Birth History Age at menarche  14 years. Gravida  2 Length (months) of breastfeeding  7-12 Maternal age  25-30 Para  2 Regular periods   Other Problems  Anxiety Disorder  Hypercholesterolemia  Lump In Breast  Migraine Headache  Other disease, cancer, significant illness  Thyroid Disease     Review of Systems  General Not Present-  Appetite Loss, Chills, Fatigue, Fever, Night Sweats, Weight Gain and Weight Loss. Skin Not Present- Change in Wart/Mole, Dryness, Hives, Jaundice, New Lesions, Non-Healing Wounds, Rash and Ulcer. HEENT Present- Hoarseness, Seasonal Allergies and Wears glasses/contact lenses. Not Present- Earache, Hearing Loss, Nose Bleed, Oral Ulcers, Ringing in the Ears, Sinus Pain, Sore Throat, Visual Disturbances and Yellow Eyes. Breast Present- Breast Mass. Not Present- Breast Pain, Nipple Discharge and Skin Changes. Cardiovascular Not Present- Chest Pain, Difficulty Breathing Lying Down, Leg Cramps, Palpitations, Rapid Heart Rate, Shortness of Breath and Swelling of Extremities. Gastrointestinal Not Present- Abdominal Pain, Bloating, Bloody Stool, Change in Bowel Habits, Chronic diarrhea, Constipation, Difficulty Swallowing, Excessive gas, Gets full quickly at meals, Hemorrhoids, Indigestion, Nausea, Rectal Pain and Vomiting. Female Genitourinary Not Present- Frequency, Nocturia, Painful Urination, Pelvic Pain and Urgency. Musculoskeletal Not Present- Back Pain, Joint Pain, Joint Stiffness, Muscle Pain, Muscle Weakness and Swelling of Extremities. Neurological Not Present- Decreased Memory, Fainting, Headaches, Numbness, Seizures, Tingling, Tremor, Trouble walking and Weakness. Psychiatric Not Present- Anxiety, Bipolar, Change in Sleep Pattern, Depression, Fearful and Frequent crying. Endocrine Not Present- Cold Intolerance, Excessive Hunger, Hair Changes, Heat Intolerance, Hot flashes and New Diabetes. Hematology Not Present- Blood Thinners, Easy Bruising, Excessive bleeding, Gland problems, HIV and Persistent Infections.  Vitals   Weight: 144.4 lb Height: 65in Body Surface Area: 1.72 m Body Mass Index: 24.03 kg/m  Temp.: 98.67F  Pulse: 95 (Regular)  BP: 108/74 (Sitting, Left Arm, Standard)       Physical Exam  General Mental Status-Alert. General Appearance-Consistent with stated  age. Hydration-Well hydrated. Voice-Normal.  Head and Neck Head-normocephalic, atraumatic with  no lesions or palpable masses. Trachea-midline. Thyroid Gland Characteristics - normal size and consistency.  Eye Eyeball - Bilateral-Extraocular movements intact. Sclera/Conjunctiva - Bilateral-No scleral icterus.  Chest and Lung Exam Chest and lung exam reveals -quiet, even and easy respiratory effort with no use of accessory muscles and on auscultation, normal breath sounds, no adventitious sounds and normal vocal resonance. Inspection Chest Wall - Normal. Back - normal.  Breast Breast - Left-Symmetric, Non Tender, No Biopsy scars, no Dimpling - Left, No Inflammation, No Lumpectomy scars, No Mastectomy scars, No Peau d' Orange. Breast - Right-Symmetric and Biopsy scar - Right, Non Tender, No Dimpling - Right, No Inflammation, No Lumpectomy scars, No Mastectomy scars, No Peau d' Orange. Note: There is a smooth, 3-4 cm cyst at the 9 o'clock position of the right breast. There is ecchymosis and a small hematoma superior to this at the area of the complex sclerosing lesion.  Cardiovascular Cardiovascular examination reveals -normal heart sounds, regular rate and rhythm with no murmurs and normal pedal pulses bilaterally.  Abdomen - Did not examine.  Neurologic - Did not examine.  Musculoskeletal - Did not examine.  Lymphatic Head & Neck  General Head & Neck Lymphatics: Bilateral - Description - Normal. Axillary  General Axillary Region: Bilateral - Description - Normal. Tenderness - Non Tender. Femoral & Inguinal - Did not examine.    Assessment & Plan   SCLEROSING ADENOSIS OF BREAST, RIGHT (N60.21)  Impression: This is a patient with a complex sclerosing lesion of the right breast as well as a symptomatic right breast cyst. Because of the findings of the complex sclerosing lesion, a radioactive seed guided right breast lumpectomy is recommended for  complete evaluation of this lesion to a malignancy. This is also because of the architectural distortion is creating on the mammograms. We will proceed with excising the breast cyst as well as it is adjacent to this area. I reviewed her mammograms and pathology results. I gave her a copy of the path. I discussed the reasons for surgery with her in detail. I discussed the surgical procedure in detail. I discussed the risk which includes but is not limited to bleeding, infection, injury to surrounding structures, need for further surgery if malignancy is found, cardiopulmonary issues, postoperative recovery, etc. She understands and wishes to proceed with the radioactive seed right breast lumpectomy as well as excision of the right breast cyst BREAST CYST, RIGHT (N60.01)

## 2019-10-01 NOTE — Anesthesia Preprocedure Evaluation (Addendum)
Anesthesia Evaluation  Patient identified by MRN, date of birth, ID band Patient awake    Reviewed: Allergy & Precautions, NPO status , Patient's Chart, lab work & pertinent test results  History of Anesthesia Complications Negative for: history of anesthetic complications  Airway Mallampati: I  TM Distance: >3 FB Neck ROM: Full    Dental no notable dental hx.    Pulmonary neg pulmonary ROS,    Pulmonary exam normal        Cardiovascular negative cardio ROS Normal cardiovascular exam     Neuro/Psych  Headaches, negative psych ROS   GI/Hepatic negative GI ROS, Neg liver ROS,   Endo/Other  negative endocrine ROS  Renal/GU negative Renal ROS  negative genitourinary   Musculoskeletal negative musculoskeletal ROS (+)   Abdominal   Peds  Hematology negative hematology ROS (+)   Anesthesia Other Findings RIGHT BREAST COMPLEX SCLEROSING LESION AND RIGHT BREAST CYST  Reproductive/Obstetrics negative OB ROS                            Anesthesia Physical Anesthesia Plan  ASA: I  Anesthesia Plan: General   Post-op Pain Management:    Induction: Intravenous  PONV Risk Score and Plan: 4 or greater and Treatment may vary due to age or medical condition, Ondansetron, Dexamethasone, Midazolam and Scopolamine patch - Pre-op  Airway Management Planned: LMA  Additional Equipment: None  Intra-op Plan:   Post-operative Plan: Extubation in OR  Informed Consent: I have reviewed the patients History and Physical, chart, labs and discussed the procedure including the risks, benefits and alternatives for the proposed anesthesia with the patient or authorized representative who has indicated his/her understanding and acceptance.     Dental advisory given  Plan Discussed with: CRNA  Anesthesia Plan Comments:        Anesthesia Quick Evaluation

## 2019-10-02 ENCOUNTER — Other Ambulatory Visit: Payer: Self-pay

## 2019-10-02 ENCOUNTER — Ambulatory Visit (HOSPITAL_BASED_OUTPATIENT_CLINIC_OR_DEPARTMENT_OTHER)
Admission: RE | Admit: 2019-10-02 | Discharge: 2019-10-02 | Disposition: A | Discharge status: Home / Self Care | Payer: BC Managed Care – PPO | Attending: Surgery | Admitting: Surgery

## 2019-10-02 ENCOUNTER — Encounter (HOSPITAL_BASED_OUTPATIENT_CLINIC_OR_DEPARTMENT_OTHER): Payer: Self-pay | Admitting: Surgery

## 2019-10-02 ENCOUNTER — Ambulatory Visit (HOSPITAL_BASED_OUTPATIENT_CLINIC_OR_DEPARTMENT_OTHER): Payer: BC Managed Care – PPO | Admitting: Anesthesiology

## 2019-10-02 ENCOUNTER — Encounter (HOSPITAL_BASED_OUTPATIENT_CLINIC_OR_DEPARTMENT_OTHER)
Admission: RE | Disposition: A | Discharge status: Home / Self Care | Payer: Self-pay | Source: Home / Self Care | Attending: Surgery

## 2019-10-02 DIAGNOSIS — G43909 Migraine, unspecified, not intractable, without status migrainosus: Secondary | ICD-10-CM | POA: Diagnosis not present

## 2019-10-02 DIAGNOSIS — R928 Other abnormal and inconclusive findings on diagnostic imaging of breast: Secondary | ICD-10-CM | POA: Diagnosis not present

## 2019-10-02 DIAGNOSIS — Z79899 Other long term (current) drug therapy: Secondary | ICD-10-CM | POA: Insufficient documentation

## 2019-10-02 DIAGNOSIS — R921 Mammographic calcification found on diagnostic imaging of breast: Secondary | ICD-10-CM | POA: Diagnosis not present

## 2019-10-02 DIAGNOSIS — N6001 Solitary cyst of right breast: Secondary | ICD-10-CM | POA: Insufficient documentation

## 2019-10-02 DIAGNOSIS — N6021 Fibroadenosis of right breast: Secondary | ICD-10-CM | POA: Diagnosis not present

## 2019-10-02 DIAGNOSIS — N6489 Other specified disorders of breast: Secondary | ICD-10-CM | POA: Diagnosis not present

## 2019-10-02 DIAGNOSIS — N6011 Diffuse cystic mastopathy of right breast: Secondary | ICD-10-CM | POA: Diagnosis not present

## 2019-10-02 HISTORY — DX: Menstrual migraine, not intractable, without status migrainosus: G43.829

## 2019-10-02 HISTORY — PX: BREAST LUMPECTOMY WITH RADIOACTIVE SEED LOCALIZATION: SHX6424

## 2019-10-02 LAB — POCT PREGNANCY, URINE: Preg Test, Ur: NEGATIVE

## 2019-10-02 SURGERY — BREAST LUMPECTOMY WITH RADIOACTIVE SEED LOCALIZATION
Anesthesia: General | Site: Breast | Laterality: Right

## 2019-10-02 MED ORDER — DEXAMETHASONE SODIUM PHOSPHATE 10 MG/ML IJ SOLN
INTRAMUSCULAR | Status: DC | PRN
  Administered 2019-10-02: 4 mg via INTRAVENOUS

## 2019-10-02 MED ORDER — ACETAMINOPHEN 500 MG PO TABS
ORAL_TABLET | ORAL | Status: AC
  Filled 2019-10-02: qty 2

## 2019-10-02 MED ORDER — ACETAMINOPHEN 500 MG PO TABS: 1000.0000 mg | ORAL_TABLET | ORAL | Status: DC

## 2019-10-02 MED ORDER — LIDOCAINE 2% (20 MG/ML) 5 ML SYRINGE
INTRAMUSCULAR | Status: AC
  Filled 2019-10-02: qty 5

## 2019-10-02 MED ORDER — OXYCODONE HCL 5 MG PO TABS
5.0000 mg | ORAL_TABLET | Freq: Once | ORAL | Status: AC | PRN
  Administered 2019-10-02: 12:00:00 5 mg via ORAL

## 2019-10-02 MED ORDER — PROMETHAZINE HCL 25 MG/ML IJ SOLN
6.2500 mg | INTRAMUSCULAR | Status: DC | PRN
  Administered 2019-10-02: 6.25 mg via INTRAVENOUS

## 2019-10-02 MED ORDER — FENTANYL CITRATE (PF) 100 MCG/2ML IJ SOLN
INTRAMUSCULAR | Status: AC
  Filled 2019-10-02: qty 2

## 2019-10-02 MED ORDER — ACETAMINOPHEN 500 MG PO TABS
1000.0000 mg | ORAL_TABLET | Freq: Once | ORAL | Status: AC
  Administered 2019-10-02: 1000 mg via ORAL

## 2019-10-02 MED ORDER — PROPOFOL 10 MG/ML IV BOLUS
INTRAVENOUS | Status: DC | PRN
  Administered 2019-10-02: 130 mg via INTRAVENOUS

## 2019-10-02 MED ORDER — ONDANSETRON HCL 4 MG/2ML IJ SOLN
INTRAMUSCULAR | Status: AC
  Filled 2019-10-02: qty 2

## 2019-10-02 MED ORDER — PROMETHAZINE HCL 25 MG/ML IJ SOLN
INTRAMUSCULAR | Status: AC
  Filled 2019-10-02: qty 1

## 2019-10-02 MED ORDER — EPHEDRINE 5 MG/ML INJ
INTRAVENOUS | Status: AC
  Filled 2019-10-02: qty 10

## 2019-10-02 MED ORDER — SCOPOLAMINE 1 MG/3DAYS TD PT72
MEDICATED_PATCH | TRANSDERMAL | Status: AC
  Filled 2019-10-02: qty 1

## 2019-10-02 MED ORDER — FENTANYL CITRATE (PF) 100 MCG/2ML IJ SOLN
25.0000 ug | INTRAMUSCULAR | Status: DC | PRN
  Administered 2019-10-02: 50 ug via INTRAVENOUS

## 2019-10-02 MED ORDER — OXYCODONE HCL 5 MG PO TABS: 5.0000 mg | ORAL_TABLET | Freq: Four times a day (QID) | ORAL | 0 refills | Status: AC | PRN

## 2019-10-02 MED ORDER — CEFAZOLIN SODIUM-DEXTROSE 2-4 GM/100ML-% IV SOLN
INTRAVENOUS | Status: AC
  Filled 2019-10-02: qty 100

## 2019-10-02 MED ORDER — OXYCODONE HCL 5 MG PO TABS
ORAL_TABLET | ORAL | Status: AC
  Filled 2019-10-02: qty 1

## 2019-10-02 MED ORDER — BUPIVACAINE HCL (PF) 0.5 % IJ SOLN
INTRAMUSCULAR | Status: DC | PRN
  Administered 2019-10-02: 20 mL

## 2019-10-02 MED ORDER — GABAPENTIN 300 MG PO CAPS
ORAL_CAPSULE | ORAL | Status: AC
  Filled 2019-10-02: qty 1

## 2019-10-02 MED ORDER — CELECOXIB 200 MG PO CAPS
ORAL_CAPSULE | ORAL | Status: AC
  Filled 2019-10-02: qty 1

## 2019-10-02 MED ORDER — CEFAZOLIN SODIUM-DEXTROSE 2-4 GM/100ML-% IV SOLN
2.0000 g | INTRAVENOUS | Status: AC
  Administered 2019-10-02: 2 g via INTRAVENOUS

## 2019-10-02 MED ORDER — BUPIVACAINE HCL (PF) 0.5 % IJ SOLN
INTRAMUSCULAR | Status: AC
  Filled 2019-10-02: qty 60

## 2019-10-02 MED ORDER — GABAPENTIN 300 MG PO CAPS
300.0000 mg | ORAL_CAPSULE | ORAL | Status: AC
  Administered 2019-10-02: 300 mg via ORAL

## 2019-10-02 MED ORDER — ONDANSETRON HCL 4 MG/2ML IJ SOLN
INTRAMUSCULAR | Status: DC | PRN
  Administered 2019-10-02: 4 mg via INTRAVENOUS

## 2019-10-02 MED ORDER — FENTANYL CITRATE (PF) 100 MCG/2ML IJ SOLN
INTRAMUSCULAR | Status: DC | PRN
  Administered 2019-10-02 (×4): 25 ug via INTRAVENOUS

## 2019-10-02 MED ORDER — LACTATED RINGERS IV SOLN: INTRAVENOUS | Status: DC

## 2019-10-02 MED ORDER — MIDAZOLAM HCL 2 MG/2ML IJ SOLN
INTRAMUSCULAR | Status: AC
  Filled 2019-10-02: qty 2

## 2019-10-02 MED ORDER — EPHEDRINE SULFATE-NACL 50-0.9 MG/10ML-% IV SOSY
PREFILLED_SYRINGE | INTRAVENOUS | Status: DC | PRN
  Administered 2019-10-02: 10 mg via INTRAVENOUS

## 2019-10-02 MED ORDER — DEXAMETHASONE SODIUM PHOSPHATE 10 MG/ML IJ SOLN
INTRAMUSCULAR | Status: AC
  Filled 2019-10-02: qty 1

## 2019-10-02 MED ORDER — MIDAZOLAM HCL 5 MG/5ML IJ SOLN
INTRAMUSCULAR | Status: DC | PRN
  Administered 2019-10-02: 2 mg via INTRAVENOUS

## 2019-10-02 MED ORDER — SCOPOLAMINE 1 MG/3DAYS TD PT72
1.0000 | MEDICATED_PATCH | Freq: Once | TRANSDERMAL | Status: DC
  Administered 2019-10-02: 1.5 mg via TRANSDERMAL

## 2019-10-02 MED ORDER — OXYCODONE HCL 5 MG/5ML PO SOLN: 5.0000 mg | Freq: Once | ORAL | Status: AC | PRN

## 2019-10-02 MED ORDER — CELECOXIB 200 MG PO CAPS
200.0000 mg | ORAL_CAPSULE | ORAL | Status: AC
  Administered 2019-10-02: 200 mg via ORAL

## 2019-10-02 MED ORDER — LIDOCAINE 2% (20 MG/ML) 5 ML SYRINGE
INTRAMUSCULAR | Status: DC | PRN
  Administered 2019-10-02: 60 mg via INTRAVENOUS

## 2019-10-02 MED ORDER — PROPOFOL 10 MG/ML IV BOLUS
INTRAVENOUS | Status: AC
  Filled 2019-10-02: qty 20

## 2019-10-02 SURGICAL SUPPLY — 51 items
APPLIER CLIP 9.375 MED OPEN (MISCELLANEOUS)
BINDER BREAST 3XL (GAUZE/BANDAGES/DRESSINGS) IMPLANT
BINDER BREAST LRG (GAUZE/BANDAGES/DRESSINGS) ×2 IMPLANT
BINDER BREAST MEDIUM (GAUZE/BANDAGES/DRESSINGS) IMPLANT
BINDER BREAST XLRG (GAUZE/BANDAGES/DRESSINGS) IMPLANT
BINDER BREAST XXLRG (GAUZE/BANDAGES/DRESSINGS) IMPLANT
BLADE HEX COATED 2.75 (ELECTRODE) ×3 IMPLANT
BLADE SURG 15 STRL LF DISP TIS (BLADE) ×1 IMPLANT
BLADE SURG 15 STRL SS (BLADE) ×2
CANISTER SUC SOCK COL 7IN (MISCELLANEOUS) IMPLANT
CANISTER SUCT 1200ML W/VALVE (MISCELLANEOUS) ×2 IMPLANT
CHLORAPREP W/TINT 26 (MISCELLANEOUS) ×3 IMPLANT
CLIP APPLIE 9.375 MED OPEN (MISCELLANEOUS) IMPLANT
COVER BACK TABLE 60X90IN (DRAPES) ×3 IMPLANT
COVER MAYO STAND STRL (DRAPES) ×3 IMPLANT
COVER PROBE W GEL 5X96 (DRAPES) ×3 IMPLANT
COVER WAND RF STERILE (DRAPES) IMPLANT
DECANTER SPIKE VIAL GLASS SM (MISCELLANEOUS) IMPLANT
DERMABOND ADVANCED (GAUZE/BANDAGES/DRESSINGS) ×2
DERMABOND ADVANCED .7 DNX12 (GAUZE/BANDAGES/DRESSINGS) ×1 IMPLANT
DRAPE LAPAROSCOPIC ABDOMINAL (DRAPES) ×3 IMPLANT
DRAPE UTILITY XL STRL (DRAPES) ×3 IMPLANT
ELECT REM PT RETURN 9FT ADLT (ELECTROSURGICAL) ×3
ELECTRODE REM PT RTRN 9FT ADLT (ELECTROSURGICAL) ×1 IMPLANT
GAUZE SPONGE 4X4 12PLY STRL LF (GAUZE/BANDAGES/DRESSINGS) IMPLANT
GLOVE BIO SURGEON STRL SZ7 (GLOVE) ×2 IMPLANT
GLOVE BIOGEL PI IND STRL 7.5 (GLOVE) IMPLANT
GLOVE BIOGEL PI INDICATOR 7.5 (GLOVE) ×4
GLOVE SURG SIGNA 7.5 PF LTX (GLOVE) ×3 IMPLANT
GOWN STRL REUS W/ TWL LRG LVL3 (GOWN DISPOSABLE) ×1 IMPLANT
GOWN STRL REUS W/ TWL XL LVL3 (GOWN DISPOSABLE) ×1 IMPLANT
GOWN STRL REUS W/TWL LRG LVL3 (GOWN DISPOSABLE) ×2
GOWN STRL REUS W/TWL XL LVL3 (GOWN DISPOSABLE) ×4
KIT MARKER MARGIN INK (KITS) ×3 IMPLANT
NDL HYPO 25X1 1.5 SAFETY (NEEDLE) ×1 IMPLANT
NEEDLE HYPO 25X1 1.5 SAFETY (NEEDLE) ×3 IMPLANT
NS IRRIG 1000ML POUR BTL (IV SOLUTION) ×2 IMPLANT
PACK BASIN DAY SURGERY FS (CUSTOM PROCEDURE TRAY) ×3 IMPLANT
PENCIL SMOKE EVACUATOR (MISCELLANEOUS) ×3 IMPLANT
SLEEVE SCD COMPRESS KNEE MED (MISCELLANEOUS) ×3 IMPLANT
SPONGE LAP 4X18 RFD (DISPOSABLE) ×5 IMPLANT
SUT MNCRL AB 4-0 PS2 18 (SUTURE) ×3 IMPLANT
SUT SILK 2 0 SH (SUTURE) IMPLANT
SUT VIC AB 3-0 SH 27 (SUTURE) ×2
SUT VIC AB 3-0 SH 27X BRD (SUTURE) ×1 IMPLANT
SYR CONTROL 10ML LL (SYRINGE) ×3 IMPLANT
TOWEL GREEN STERILE FF (TOWEL DISPOSABLE) ×3 IMPLANT
TRAY FAXITRON CT DISP (TRAY / TRAY PROCEDURE) ×3 IMPLANT
TUBE CONNECTING 20'X1/4 (TUBING) ×1
TUBE CONNECTING 20X1/4 (TUBING) ×1 IMPLANT
YANKAUER SUCT BULB TIP NO VENT (SUCTIONS) ×2 IMPLANT

## 2019-10-02 NOTE — Op Note (Signed)
RIGHT BREAST LUMPECTOMY WITH RADIOACTIVE SEED LOCALIZATION AND RIGHT BREAST CYST EXCISION  Procedure Note  Kristin Gay 10/02/2019   Pre-op Diagnosis: RIGHT BREAST COMPLEX SCLEROSING LESION AND RIGHT BREAST CYST     Post-op Diagnosis: same  Procedure(s): RIGHT BREAST LUMPECTOMY WITH RADIOACTIVE SEED LOCALIZATION AND RIGHT BREAST CYST EXCISION  Surgeon(s): Abigail Miyamoto, MD  Anesthesia: General  Staff:  Circulator: Lenn Cal, RN Scrub Person: Monika Salk, CST; Earlie Server, CST  Estimated Blood Loss: Minimal               Specimens: sent to path  Indications: This is a 49 year old female who was found to have an abnormality in the right breast in the upper outer quadrant.  Stereotactic biopsy of this area found on mammogram showed a complex grossing lesion.  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  Excision of both areas has been recommended.  Procedure: The patient was brought to the operating room identifies correct patient.  She is placed upon the operating table and general anesthesia was induced.  Her right breast was then prepped and draped in usual sterile fashion.  The large cyst was easily palpable at the 9 o'clock position of the right breast.  Identified the radioactive seed in the upper outer quadrant of the breast with the neoprobe.  I anesthetized skin overlying the seed with Marcaine and then made a longitudinal incision with a scalpel.  I then dissected down to the location of the seed with the aid of the neoprobe.  I then performed a lumpectomy with the aid of neoprobe staying around the seed.  Once the lumpectomy specimen was removed, I confirmed that the seed was in the specimen with the neoprobe.  All margins were then covered with marker paint.  An x-ray was then performed on specimen showing the previous biopsy clip as well as the radioactive seed in the specimen.  The specimen was then sent to pathology  for evaluation.  I then dissected inferiorly and identified the large breast cyst which I was able to grasp with an Allis clamp and excised in its entirety aspirating some of fluid from the cyst.  This was likewise sent to pathology.  Hemostasis was then achieved with the cautery.  I anesthetized the incision wound further with Marcaine.  I then closed the deep subcutaneous tissue with interrupted 3-0 Vicryl sutures and closed the skin with a running 4-0 Monocryl.  Dermabond was then applied.  The patient was then placed in a breast binder.  She tolerated the procedure well.  All the counts were correct at the end of the procedure.  She was then extubated in the operating room and taken in stable condition to the recovery room.          Abigail Miyamoto   Date: 10/02/2019  Time: 11:09 AM

## 2019-10-02 NOTE — Discharge Instructions (Signed)
[facility] Office Phone Number [phone]  BREAST BIOPSY/ PARTIAL MASTECTOMY: POST OP INSTRUCTIONS  Always review your discharge instruction sheet given to you by the facility where your surgery was performed.  IF YOU HAVE DISABILITY OR FAMILY LEAVE FORMS, YOU MUST BRING THEM TO THE OFFICE FOR PROCESSING.  DO NOT GIVE THEM TO YOUR DOCTOR.  1. A prescription for pain medication may be given to you upon discharge.  Take your pain medication as prescribed, if needed.  If narcotic pain medicine is not needed, then you may take acetaminophen (Tylenol) or ibuprofen (Advil) as needed. 2. Take your usually prescribed medications unless otherwise directed 3. If you need a refill on your pain medication, please contact your pharmacy.  They will contact our office to request authorization.  Prescriptions will not be filled after 5pm or on week-ends. 4. You should eat very light the first 24 hours after surgery, such as soup, crackers, pudding, etc.  Resume your normal diet the day after surgery. 5. Most patients will experience some swelling and bruising in the breast.  Ice packs and a good support bra will help.  Swelling and bruising can take several days to resolve.  6. It is common to experience some constipation if taking pain medication after surgery.  Increasing fluid intake and taking a stool softener will usually help or prevent this problem from occurring.  A mild laxative (Milk of Magnesia or Miralax) should be taken according to package directions if there are no bowel movements after 48 hours. 7. Unless discharge instructions indicate otherwise, you may remove your bandages 24-48 hours after surgery, and you may shower at that time.  You may have steri-strips (small skin tapes) in place directly over the incision.  These strips should be left on the skin for 7-10 days.  If your surgeon used skin glue on the incision, you may shower in 24 hours.  The glue will flake off over the  next 2-3 weeks.  Any sutures or staples will be removed at the office during your follow-up visit. 8. ACTIVITIES:  You may resume regular daily activities (gradually increasing) beginning the next day.  Wearing a good support bra or sports bra minimizes pain and swelling.  You may have sexual intercourse when it is comfortable. a. You may drive when you no longer are taking prescription pain medication, you can comfortably wear a seatbelt, and you can safely maneuver your car and apply brakes. b. RETURN TO WORK:  ______________________________________________________________________________________ 9. You should see your doctor in the office for a follow-up appointment approximately two weeks after your surgery.  Your doctor's nurse will typically make your follow-up appointment when she calls you with your pathology report.  Expect your pathology report 2-3 business days after your surgery.  You may call to check if you do not hear from [place] after three days. 10. OTHER INSTRUCTIONS:OK TO SHOWER STARTING TOMORROW 11. ICE PACK, TYLENOL, AND IBUPROFEN ALSO FOR PAIN 12. NO VIGOROUS ACTIVITY FOR ONE WEEK 13. OK TO REMOVE THE BINDER IN THE MORNING OR IF YOU ARE UNCOMFORTABLE. _______________________________________________________________________________________________ _____________________________________________________________________________________________________________________________________ _____________________________________________________________________________________________________________________________________ _____________________________________________________________________________________________________________________________________  WHEN TO CALL YOUR DOCTOR: 1. Fever over 101.0 2. Nausea and/or vomiting. 3. Extreme swelling or bruising. 4. Continued bleeding from incision. 5. Increased pain, redness, or drainage from the incision.  The clinic staff is available to answer  your questions during regular business hours.  Please don't hesitate to call and ask to speak to one of the nurses for clinical concerns.  If you have  a medical emergency, go to the nearest emergency room or call 911.  A surgeon from [facility] is always on call at the hospital.  For further questions, please visit [email or web address]      NO TYLENOL PRODUCTS UNTIL 4:15 pm.   NO ADVIL/MOTRIN UNTIL 4:15 PM IF NEEDED  Next dose of oxycodone is at 6:15pm today    Post Anesthesia Home Care Instructions  Activity: Get plenty of rest for the remainder of the day. A responsible individual must stay with you for 24 hours following the procedure.  For the next 24 hours, DO NOT: -Drive a car -Paediatric nurse -Drink alcoholic beverages -Take any medication unless instructed by your physician -Make any legal decisions or sign important papers.  Meals: Start with liquid foods such as gelatin or soup. Progress to regular foods as tolerated. Avoid greasy, spicy, heavy foods. If nausea and/or vomiting occur, drink only clear liquids until the nausea and/or vomiting subsides. Call your physician if vomiting continues.  Special Instructions/Symptoms: Your throat may feel dry or sore from the anesthesia or the breathing tube placed in your throat during surgery. If this causes discomfort, gargle with warm salt water. The discomfort should disappear within 24 hours.  If you had a scopolamine patch placed behind your ear for the management of post- operative nausea and/or vomiting:  1. The medication in the patch is effective for 72 hours, after which it should be removed.  Wrap patch in a tissue and discard in the trash. Wash hands thoroughly with soap and water. 2. You may remove the patch earlier than 72 hours if you experience unpleasant side effects which may include dry mouth, dizziness or visual disturbances. 3. Avoid touching the patch. Wash your hands with soap and  water after contact with the patch.

## 2019-10-02 NOTE — Anesthesia Postprocedure Evaluation (Signed)
Anesthesia Post Note  Patient: Kristin Gay  Procedure(s) Performed: RIGHT BREAST LUMPECTOMY WITH RADIOACTIVE SEED LOCALIZATION AND RIGHT BREAST CYST EXCISION (Right Breast)     Patient location during evaluation: PACU Anesthesia Type: General Level of consciousness: awake and alert and oriented Pain management: pain level controlled Vital Signs Assessment: post-procedure vital signs reviewed and stable Respiratory status: spontaneous breathing, nonlabored ventilation and respiratory function stable Cardiovascular status: blood pressure returned to baseline Postop Assessment: no apparent nausea or vomiting Anesthetic complications: no    Last Vitals:  Vitals:   10/02/19 1200 10/02/19 1215  BP: (!) 151/80 (!) 146/73  Pulse: 87 80  Resp: (!) 23 (!) 24  Temp:    SpO2: 100% 100%    Last Pain:  Vitals:   10/02/19 1216  TempSrc:   PainSc: 4                  Kaylyn Layer

## 2019-10-02 NOTE — Interval H&P Note (Signed)
History and Physical Interval Note:no change in H and P  10/02/2019 10:20 AM  [name]  has presented today for surgery, with the diagnosis of RIGHT BREAST COMPLEX SCLEROSING LESION AND RIGHT BREAST CYST.  The various methods of treatment have been discussed with the patient and family. After consideration of risks, benefits and other options for treatment, the patient has consented to  Procedure(s): RIGHT BREAST LUMPECTOMY WITH RADIOACTIVE SEED LOCALIZATION AND RIGHT BREAST CYST EXCISION (Right) as a surgical intervention.  The patient's history has been reviewed, patient examined, no change in status, stable for surgery.  I have reviewed the patient's chart and labs.  Questions were answered to the patient's satisfaction.     Kristin Gay

## 2019-10-02 NOTE — Transfer of Care (Signed)
Immediate Anesthesia Transfer of Care Note  Patient: Neveyah Garzon  Procedure(s) Performed: RIGHT BREAST LUMPECTOMY WITH RADIOACTIVE SEED LOCALIZATION AND RIGHT BREAST CYST EXCISION (Right Breast)  Patient Location: PACU  Anesthesia Type:General  Level of Consciousness: drowsy and patient cooperative  Airway & Oxygen Therapy: Patient Spontanous Breathing and Patient connected to face mask oxygen  Post-op Assessment: Report given to RN and Post -op Vital signs reviewed and stable  Post vital signs: Reviewed and stable  Last Vitals:  Vitals Value Taken Time  BP 110/67 10/02/19 1115  Temp    Pulse 79 10/02/19 1115  Resp 12 10/02/19 1115  SpO2 100 % 10/02/19 1115  Vitals shown include unvalidated device data.  Last Pain:  Vitals:   10/02/19 0949  TempSrc: Oral  PainSc: 0-No pain      Patients Stated Pain Goal: 3 (10/02/19 0949)  Complications: No apparent anesthesia complications

## 2019-10-02 NOTE — Anesthesia Procedure Notes (Signed)
Procedure Name: LMA Insertion Date/Time: 10/02/2019 10:31 AM Performed by: Pearson Grippe, CRNA Pre-anesthesia Checklist: Patient identified, Emergency Drugs available, Suction available and Patient being monitored Patient Re-evaluated:Patient Re-evaluated prior to induction Oxygen Delivery Method: Circle system utilized Preoxygenation: Pre-oxygenation with 100% oxygen Induction Type: IV induction Ventilation: Mask ventilation without difficulty LMA: LMA inserted LMA Size: 4.0 Number of attempts: 1 Airway Equipment and Method: Bite block Placement Confirmation: positive ETCO2 Tube secured with: Tape Dental Injury: Teeth and Oropharynx as per pre-operative assessment

## 2019-10-03 ENCOUNTER — Encounter: Payer: Self-pay | Admitting: *Deleted

## 2019-10-08 LAB — SURGICAL PATHOLOGY

## 2020-05-20 DIAGNOSIS — Z111 Encounter for screening for respiratory tuberculosis: Secondary | ICD-10-CM | POA: Diagnosis not present

## 2020-06-11 DIAGNOSIS — G43909 Migraine, unspecified, not intractable, without status migrainosus: Secondary | ICD-10-CM | POA: Diagnosis not present

## 2020-06-11 DIAGNOSIS — Z0000 Encounter for general adult medical examination without abnormal findings: Secondary | ICD-10-CM | POA: Diagnosis not present

## 2020-06-11 DIAGNOSIS — Z131 Encounter for screening for diabetes mellitus: Secondary | ICD-10-CM | POA: Diagnosis not present

## 2020-06-11 DIAGNOSIS — E063 Autoimmune thyroiditis: Secondary | ICD-10-CM | POA: Diagnosis not present

## 2020-06-11 DIAGNOSIS — Z13220 Encounter for screening for lipoid disorders: Secondary | ICD-10-CM | POA: Diagnosis not present

## 2020-07-25 DIAGNOSIS — Z01419 Encounter for gynecological examination (general) (routine) without abnormal findings: Secondary | ICD-10-CM | POA: Diagnosis not present

## 2020-07-25 DIAGNOSIS — Z6823 Body mass index (BMI) 23.0-23.9, adult: Secondary | ICD-10-CM | POA: Diagnosis not present

## 2020-07-29 ENCOUNTER — Other Ambulatory Visit: Payer: Self-pay | Admitting: Obstetrics & Gynecology

## 2020-07-29 DIAGNOSIS — N6320 Unspecified lump in the left breast, unspecified quadrant: Secondary | ICD-10-CM

## 2020-08-14 DIAGNOSIS — N6002 Solitary cyst of left breast: Secondary | ICD-10-CM | POA: Diagnosis not present

## 2020-08-14 DIAGNOSIS — N6320 Unspecified lump in the left breast, unspecified quadrant: Secondary | ICD-10-CM | POA: Diagnosis not present

## 2020-08-14 DIAGNOSIS — R922 Inconclusive mammogram: Secondary | ICD-10-CM | POA: Diagnosis not present

## 2020-08-29 ENCOUNTER — Other Ambulatory Visit: Payer: BC Managed Care – PPO

## 2021-06-14 IMAGING — MG DIGITAL DIAGNOSTIC BILAT W/ TOMO W/ CAD
6 of 12 series · 6 of 36 positions shown · non-contrast
Comparison: Previous exam(s).

CLINICAL DATA: Follow-up questionable architectural distortion in
the upper-outer right breast biopsied under stereotactic guidance on
11/21/2018 demonstrating benign breast parenchyma with fibrocystic
change, including adenosis. There was 2 cm of superior migration of
the clip on the post clip mammogram. The patient also reports a
palpable mass in each breast since [REDACTED] or Sahib of this year.
She does not feel the mass on the left today. The last time she felt
the mass on the left was at the time of her physical examination at
the end June 2019, prior to starting her menstrual period.

EXAM:
DIGITAL DIAGNOSTIC BILATERAL MAMMOGRAM WITH CAD AND TOMO
ULTRASOUND BILATERAL BREAST

[R TAN synth-2D (1 of 2)]
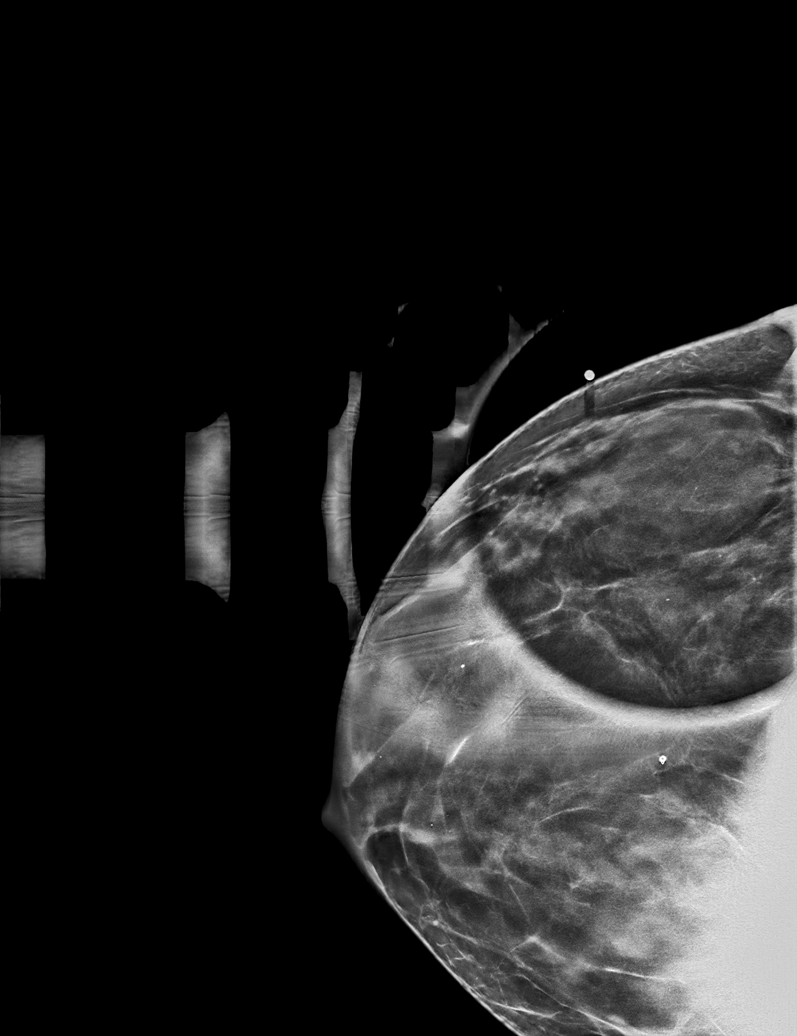

[R MLO synth-2D]
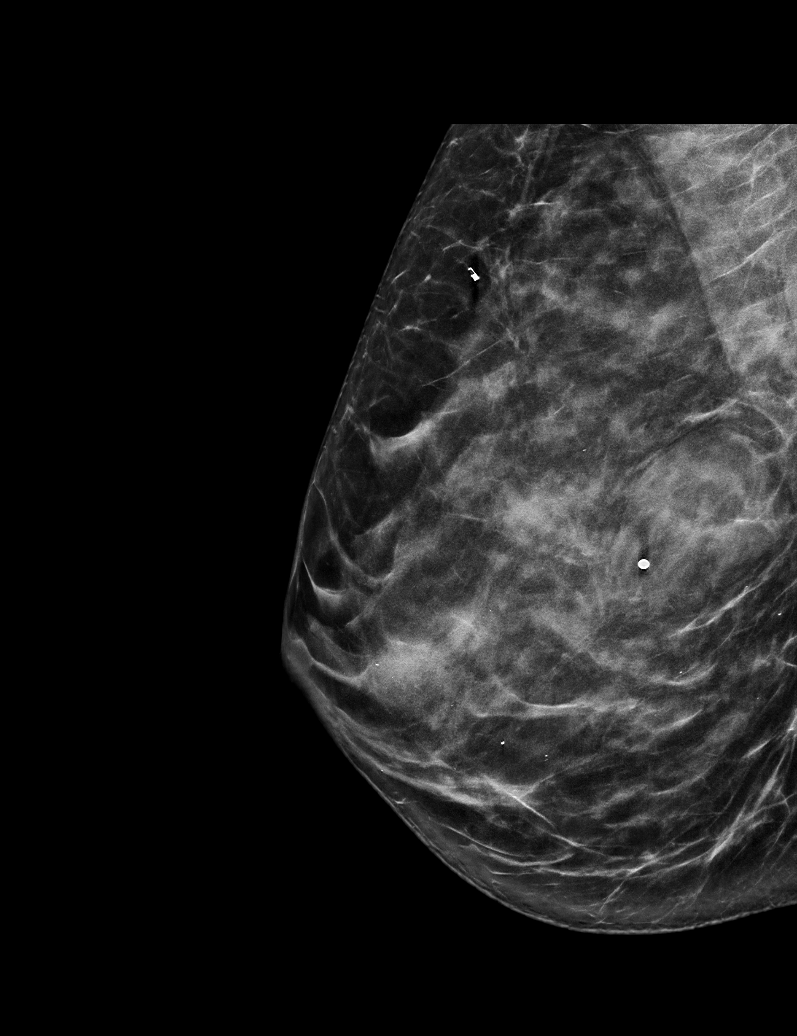

[L CC synth-2D]
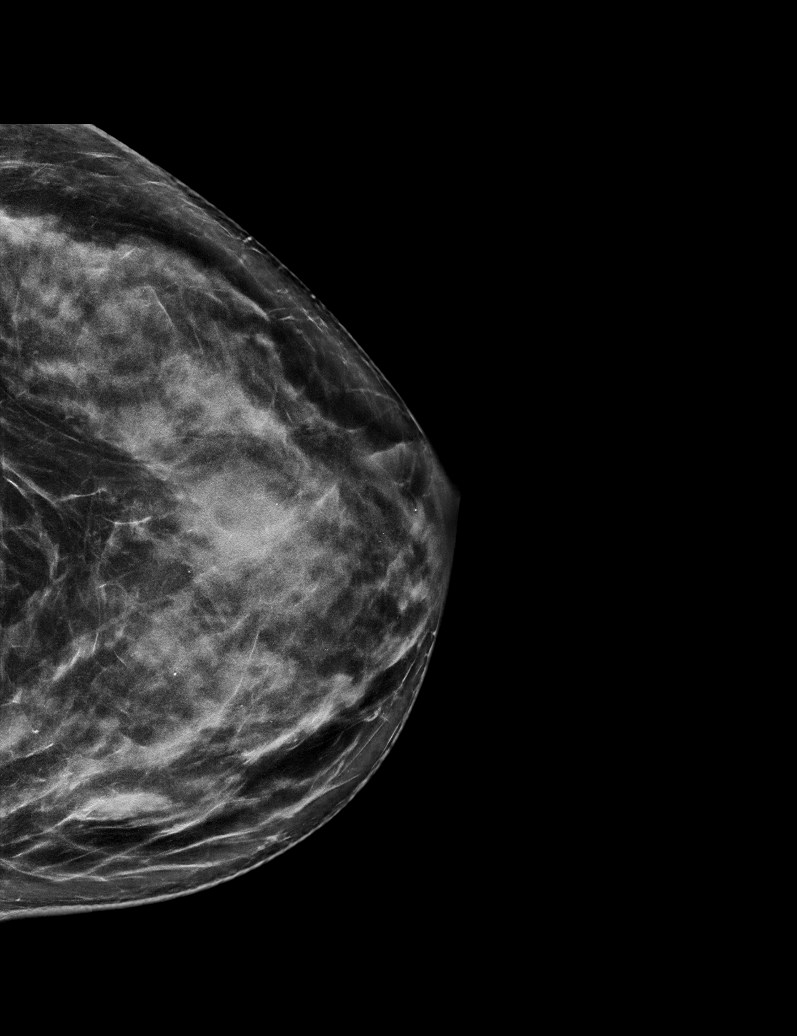

[L MLO synth-2D]
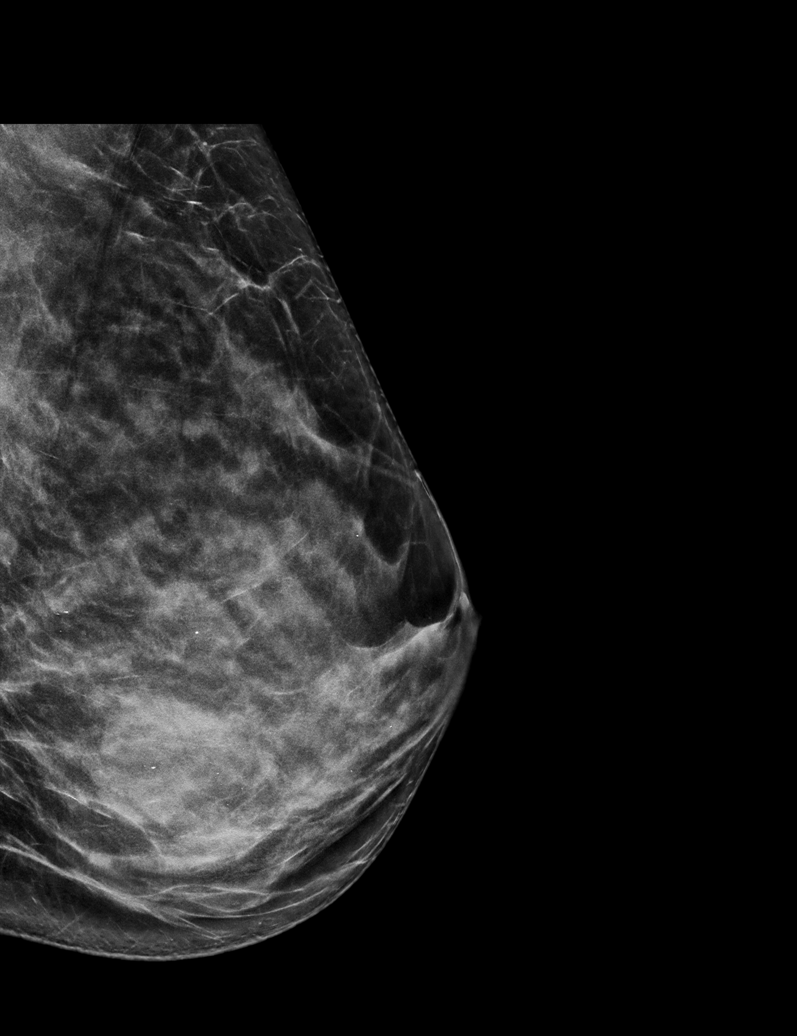

[R TAN synth-2D (2 of 2)]
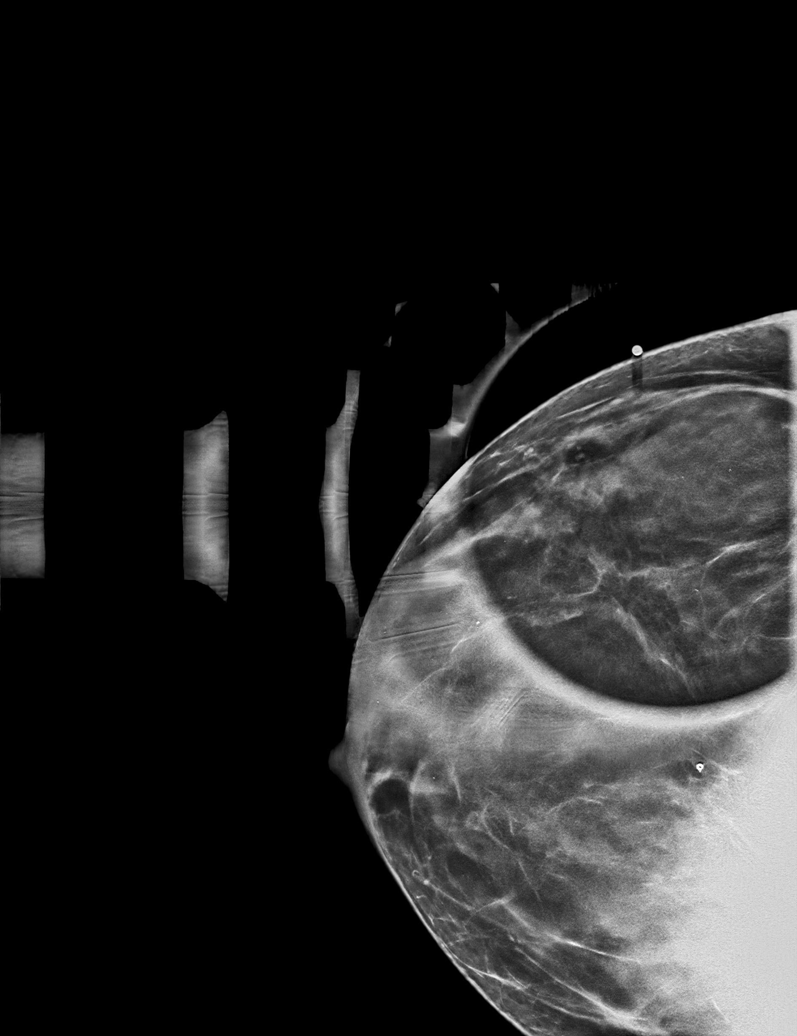

[R CC synth-2D]
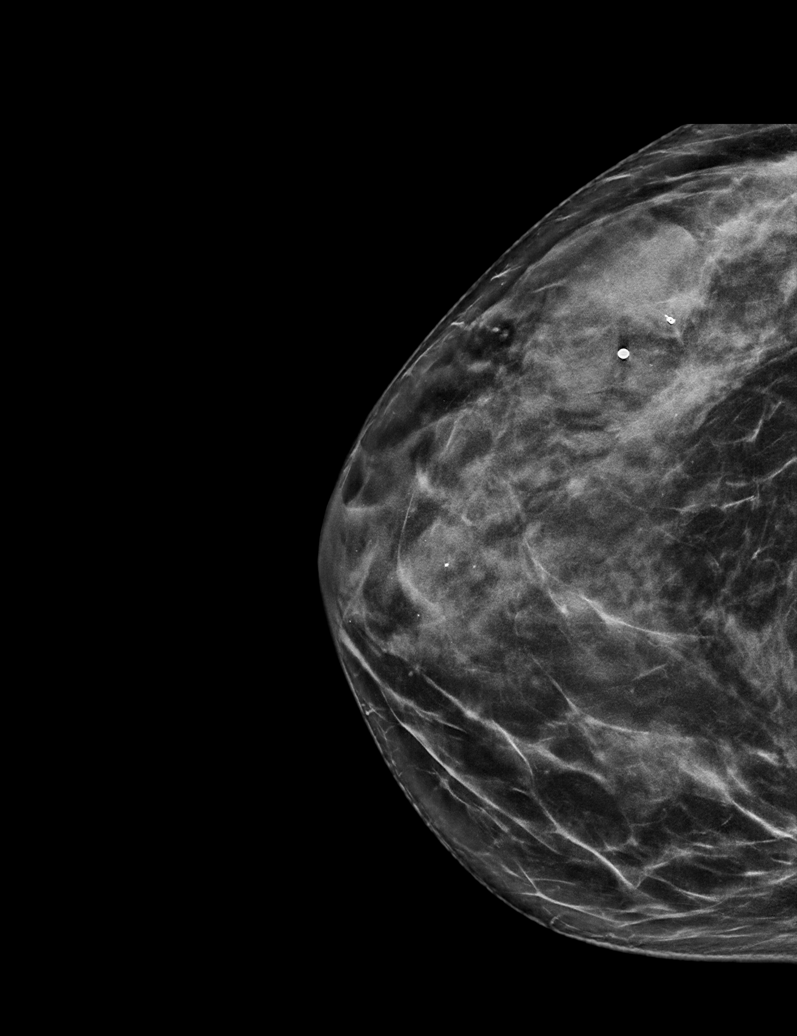

[6 of 36 positions shown; findings below may reference images not displayed]

ACR Breast Density Category c: The breast tissue is heterogeneously
dense, which may obscure small masses.
FINDINGS: There is a better defined, discrete area of distortion in the
upper-outer quadrant of the right breast. The center of distortion
is located 2 cm inferior and posterior to the previously placed coil
shaped biopsy marker clip.

An oval, circumscribed mass is demonstrated in the upper outer right
breast at the location of the mass felt by the patient, marked with
a metallic marker. This corresponds to a previously evaluated cyst.

No mammographically visible mass in the outer left breast in the
region of the recently felt mass.

Mammographic images were processed with CAD.

On physical exam, there is an approximately 4 x 2.5 cm oval,
circumscribed, mobile palpable mass in the. No mass is palpable
elsewhere in the upper outer right breast. There are no palpable
right axillary lymph nodes. No mass is palpable in the 3 o'clock
position of the left breast at the location of the mass recently
felt by the patient.

Targeted ultrasound is performed, showing a small area of
ill-defined decreased echogenicity and ill-defined surrounding
increased echogenicity in the 10 o'clock position of the right
breast, 5 cm from the nipple, with possible associated subtle
architectural distortion. This is subtle and difficult to reproduce
in 2 planes. No other findings in the area of architectural
distortion.

In the 9 o'clock position of the right breast, 6 cm from the nipple,
a 4.3 cm simple cyst is demonstrated. This corresponds to the
palpable mass.

Ultrasound of the right axilla demonstrated normal appearing right
axillary lymph nodes.

Ultrasound of the lateral left breast demonstrates multiple cysts in
the region of the recently palpated mass. The largest measures
cm in maximum diameter, in the 3 o'clock position, 6 cm from the
nipple. This contains a mildly thickened internal septation and
small amount of dependent calcification with no internal blood flow
power Doppler. No solid masses were seen.
IMPRESSION: 1. More discrete area of architectural distortion in the upper-outer
quadrant of the right breast, 2 cm inferior and posterior to the
previously placed coil shaped biopsy marker clip. This is best seen
in the oblique today. No well-defined corresponding sonographic
abnormality.
2. 4.3 cm simple cyst in the 9 o'clock position of the right breast,
corresponding to the palpable mass.
3. Multiple benign left breast cysts.

RECOMMENDATION:
Stereotactic guided core needle biopsy of the more discrete area of
architectural distortion seen in the upper-outer quadrant of the
right breast today. This is best seen in the oblique projection
today and may be better seen with a lateral approach. This has been
discussed with the patient and scheduled at [DATE] a.m. on 08/13/2019
per patient request.

I have discussed the findings and recommendations with the patient.
If applicable, a reminder letter will be sent to the patient
regarding the next appointment.

BI-RADS CATEGORY  4: Suspicious.
